# Patient Record
Sex: Female | Born: 1995 | Race: White | Hispanic: No | Marital: Single | Smoking: Never smoker
Health system: Southern US, Community
[De-identification: ages and names within clinical notes are randomized; demographics above are authoritative.]

---
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
## 2015-07-03 ENCOUNTER — Other Ambulatory Visit: Payer: Self-pay | Admitting: Nurse Practitioner

## 2015-07-03 DIAGNOSIS — N6320 Unspecified lump in the left breast, unspecified quadrant: Secondary | ICD-10-CM

## 2015-07-10 ENCOUNTER — Ambulatory Visit
Admission: RE | Admit: 2015-07-10 | Discharge: 2015-07-10 | Disposition: A | Discharge status: Home / Self Care | Payer: BLUE CROSS/BLUE SHIELD | Source: Ambulatory Visit | Attending: Nurse Practitioner | Admitting: Nurse Practitioner

## 2015-07-10 DIAGNOSIS — N6320 Unspecified lump in the left breast, unspecified quadrant: Secondary | ICD-10-CM
